# Patient Record
Sex: Male | Born: 1990 | Race: White | Hispanic: No | Marital: Single | Smoking: Never smoker
Health system: Southern US, Community
[De-identification: ages and names within clinical notes are randomized; demographics above are authoritative.]

---

## 2011-11-08 ENCOUNTER — Ambulatory Visit
Admission: RE | Admit: 2011-11-08 | Discharge: 2011-11-08 | Disposition: A | Discharge status: Home / Self Care | Payer: PRIVATE HEALTH INSURANCE | Source: Ambulatory Visit | Attending: Orthopedic Surgery | Admitting: Orthopedic Surgery

## 2011-11-08 ENCOUNTER — Other Ambulatory Visit: Payer: Self-pay | Admitting: Orthopedic Surgery

## 2011-11-08 DIAGNOSIS — T148XXA Other injury of unspecified body region, initial encounter: Secondary | ICD-10-CM

## 2019-08-12 ENCOUNTER — Other Ambulatory Visit: Payer: Self-pay

## 2019-08-12 ENCOUNTER — Encounter (HOSPITAL_COMMUNITY): Payer: Self-pay | Admitting: Emergency Medicine

## 2019-08-12 ENCOUNTER — Observation Stay (HOSPITAL_COMMUNITY)
Admission: EM | Admit: 2019-08-12 | Discharge: 2019-08-14 | Disposition: A | Discharge status: Home / Self Care | Payer: BC Managed Care – PPO | Attending: Surgery | Admitting: Surgery

## 2019-08-12 DIAGNOSIS — K3580 Unspecified acute appendicitis: Secondary | ICD-10-CM | POA: Diagnosis present

## 2019-08-12 DIAGNOSIS — Z79899 Other long term (current) drug therapy: Secondary | ICD-10-CM | POA: Insufficient documentation

## 2019-08-12 DIAGNOSIS — Z20828 Contact with and (suspected) exposure to other viral communicable diseases: Secondary | ICD-10-CM | POA: Insufficient documentation

## 2019-08-12 DIAGNOSIS — R1031 Right lower quadrant pain: Secondary | ICD-10-CM

## 2019-08-12 DIAGNOSIS — Z7982 Long term (current) use of aspirin: Secondary | ICD-10-CM | POA: Insufficient documentation

## 2019-08-12 LAB — COMPREHENSIVE METABOLIC PANEL
ALT: 17 U/L (ref 0–44)
AST: 23 U/L (ref 15–41)
Albumin: 4.4 g/dL (ref 3.5–5.0)
Alkaline Phosphatase: 55 U/L (ref 38–126)
Anion gap: 12 (ref 5–15)
BUN: 12 mg/dL (ref 6–20)
CO2: 28 mmol/L (ref 22–32)
Calcium: 9.5 mg/dL (ref 8.9–10.3)
Chloride: 100 mmol/L (ref 98–111)
Creatinine, Ser: 1.34 mg/dL — ABNORMAL HIGH (ref 0.61–1.24)
GFR calc Af Amer: >60 mL/min (ref >60)
GFR calc non Af Amer: >60 mL/min (ref >60)
Glucose, Bld: 117 mg/dL — ABNORMAL HIGH (ref 70–99)
Potassium: 3.7 mmol/L (ref 3.5–5.1)
Sodium: 140 mmol/L (ref 135–145)
Total Bilirubin: 1.1 mg/dL (ref 0.3–1.2)
Total Protein: 7.3 g/dL (ref 6.5–8.1)

## 2019-08-12 LAB — CBC
HCT: 47.7 % (ref 39.0–52.0)
Hemoglobin: 16.5 g/dL (ref 13.0–17.0)
MCH: 30.6 pg (ref 26.0–34.0)
MCHC: 34.6 g/dL (ref 30.0–36.0)
MCV: 88.5 fL (ref 80.0–100.0)
Platelets: 232 10*3/uL (ref 150–400)
RBC: 5.39 MIL/uL (ref 4.22–5.81)
RDW: 11.9 % (ref 11.5–15.5)
WBC: 16 10*3/uL — ABNORMAL HIGH (ref 4.0–10.5)
nRBC: 0 % (ref 0.0–0.2)

## 2019-08-12 LAB — URINALYSIS, ROUTINE W REFLEX MICROSCOPIC
Bilirubin Urine: NEGATIVE
Glucose, UA: NEGATIVE mg/dL
Hgb urine dipstick: NEGATIVE
Ketones, ur: NEGATIVE mg/dL
Leukocytes,Ua: NEGATIVE
Nitrite: NEGATIVE
Protein, ur: NEGATIVE mg/dL
Specific Gravity, Urine: 1.02 (ref 1.005–1.030)
pH: 6 (ref 5.0–8.0)

## 2019-08-12 LAB — LIPASE, BLOOD: Lipase: 20 U/L (ref 11–51)

## 2019-08-12 NOTE — ED Triage Notes (Signed)
Patient reports RLQ abdominal pain onset 4pm this afternoon , no emesis or diarrhea , denies fever or chills .

## 2019-08-13 ENCOUNTER — Encounter (HOSPITAL_COMMUNITY)
Admission: EM | Disposition: A | Discharge status: Home / Self Care | Payer: Self-pay | Source: Home / Self Care | Attending: Emergency Medicine

## 2019-08-13 ENCOUNTER — Encounter (HOSPITAL_COMMUNITY): Payer: Self-pay | Admitting: Certified Registered Nurse Anesthetist

## 2019-08-13 ENCOUNTER — Emergency Department (HOSPITAL_COMMUNITY): Payer: BC Managed Care – PPO

## 2019-08-13 ENCOUNTER — Observation Stay (HOSPITAL_COMMUNITY): Payer: BC Managed Care – PPO | Admitting: Anesthesiology

## 2019-08-13 DIAGNOSIS — K3580 Unspecified acute appendicitis: Secondary | ICD-10-CM | POA: Diagnosis present

## 2019-08-13 HISTORY — PX: LAPAROSCOPIC APPENDECTOMY: SHX408

## 2019-08-13 LAB — COMPREHENSIVE METABOLIC PANEL
ALT: 17 U/L (ref 0–44)
AST: 20 U/L (ref 15–41)
Albumin: 4.1 g/dL (ref 3.5–5.0)
Alkaline Phosphatase: 51 U/L (ref 38–126)
Anion gap: 11 (ref 5–15)
BUN: 12 mg/dL (ref 6–20)
CO2: 25 mmol/L (ref 22–32)
Calcium: 9.3 mg/dL (ref 8.9–10.3)
Chloride: 102 mmol/L (ref 98–111)
Creatinine, Ser: 1.33 mg/dL — ABNORMAL HIGH (ref 0.61–1.24)
GFR calc Af Amer: >60 mL/min (ref >60)
GFR calc non Af Amer: >60 mL/min (ref >60)
Glucose, Bld: 109 mg/dL — ABNORMAL HIGH (ref 70–99)
Potassium: 4 mmol/L (ref 3.5–5.1)
Sodium: 138 mmol/L (ref 135–145)
Total Bilirubin: 1.6 mg/dL — ABNORMAL HIGH (ref 0.3–1.2)
Total Protein: 7 g/dL (ref 6.5–8.1)

## 2019-08-13 LAB — CBC
HCT: 48 % (ref 39.0–52.0)
Hemoglobin: 16.3 g/dL (ref 13.0–17.0)
MCH: 30.4 pg (ref 26.0–34.0)
MCHC: 34 g/dL (ref 30.0–36.0)
MCV: 89.4 fL (ref 80.0–100.0)
Platelets: 214 10*3/uL (ref 150–400)
RBC: 5.37 MIL/uL (ref 4.22–5.81)
RDW: 11.9 % (ref 11.5–15.5)
WBC: 14.3 10*3/uL — ABNORMAL HIGH (ref 4.0–10.5)
nRBC: 0 % (ref 0.0–0.2)

## 2019-08-13 LAB — HIV ANTIBODY (ROUTINE TESTING W REFLEX): HIV Screen 4th Generation wRfx: NONREACTIVE

## 2019-08-13 LAB — SURGICAL PCR SCREEN
MRSA, PCR: NEGATIVE
Staphylococcus aureus: NEGATIVE

## 2019-08-13 LAB — SARS CORONAVIRUS 2 (TAT 6-24 HRS): SARS Coronavirus 2: NEGATIVE

## 2019-08-13 SURGERY — APPENDECTOMY, LAPAROSCOPIC
Anesthesia: General | Site: Abdomen

## 2019-08-13 MED ORDER — DIPHENHYDRAMINE HCL 50 MG/ML IJ SOLN
INTRAMUSCULAR | Status: DC | PRN
  Administered 2019-08-13: 12.5 mg via INTRAVENOUS

## 2019-08-13 MED ORDER — LIDOCAINE 2% (20 MG/ML) 5 ML SYRINGE
INTRAMUSCULAR | Status: AC
  Filled 2019-08-13: qty 5

## 2019-08-13 MED ORDER — METRONIDAZOLE IN NACL 5-0.79 MG/ML-% IV SOLN
500.0000 mg | Freq: Three times a day (TID) | INTRAVENOUS | Status: DC
  Administered 2019-08-13 – 2019-08-14 (×4): 500 mg via INTRAVENOUS
  Filled 2019-08-13 (×4): qty 100

## 2019-08-13 MED ORDER — FENTANYL CITRATE (PF) 100 MCG/2ML IJ SOLN: 25.0000 ug | INTRAMUSCULAR | Status: DC | PRN

## 2019-08-13 MED ORDER — ONDANSETRON HCL 4 MG/2ML IJ SOLN
INTRAMUSCULAR | Status: DC | PRN
  Administered 2019-08-13: 4 mg via INTRAVENOUS

## 2019-08-13 MED ORDER — METOPROLOL TARTRATE 5 MG/5ML IV SOLN: 5.0000 mg | Freq: Four times a day (QID) | INTRAVENOUS | Status: DC | PRN

## 2019-08-13 MED ORDER — OXYCODONE HCL 5 MG PO TABS
5.0000 mg | ORAL_TABLET | Freq: Four times a day (QID) | ORAL | Status: DC | PRN
  Administered 2019-08-13: 21:00:00 5 mg via ORAL
  Filled 2019-08-13: qty 1

## 2019-08-13 MED ORDER — SODIUM CHLORIDE 0.9 % IR SOLN
Status: DC | PRN
  Administered 2019-08-13: 1000 mL

## 2019-08-13 MED ORDER — PROPOFOL 10 MG/ML IV BOLUS
INTRAVENOUS | Status: DC | PRN
  Administered 2019-08-13: 200 mg via INTRAVENOUS

## 2019-08-13 MED ORDER — LIDOCAINE 2% (20 MG/ML) 5 ML SYRINGE
INTRAMUSCULAR | Status: DC | PRN
  Administered 2019-08-13: 60 mg via INTRAVENOUS

## 2019-08-13 MED ORDER — POLYETHYLENE GLYCOL 3350 17 G PO PACK: 17.0000 g | PACK | Freq: Every day | ORAL | Status: DC | PRN

## 2019-08-13 MED ORDER — MIDAZOLAM HCL 2 MG/2ML IJ SOLN
INTRAMUSCULAR | Status: DC | PRN
  Administered 2019-08-13: 2 mg via INTRAVENOUS

## 2019-08-13 MED ORDER — ONDANSETRON HCL 4 MG/2ML IJ SOLN: 4.0000 mg | Freq: Four times a day (QID) | INTRAMUSCULAR | Status: DC | PRN

## 2019-08-13 MED ORDER — STERILE WATER FOR IRRIGATION IR SOLN
Status: DC | PRN
  Administered 2019-08-13: 1000 mL

## 2019-08-13 MED ORDER — ONDANSETRON 4 MG PO TBDP: 4.0000 mg | ORAL_TABLET | Freq: Four times a day (QID) | ORAL | Status: DC | PRN

## 2019-08-13 MED ORDER — BUPIVACAINE-EPINEPHRINE (PF) 0.25% -1:200000 IJ SOLN
INTRAMUSCULAR | Status: AC
  Filled 2019-08-13: qty 30

## 2019-08-13 MED ORDER — ONDANSETRON HCL 4 MG/2ML IJ SOLN
4.0000 mg | Freq: Once | INTRAMUSCULAR | Status: AC
  Administered 2019-08-13: 02:00:00 4 mg via INTRAVENOUS
  Filled 2019-08-13: qty 2

## 2019-08-13 MED ORDER — LACTATED RINGERS IV SOLN
INTRAVENOUS | Status: DC | PRN
  Administered 2019-08-13 (×2): via INTRAVENOUS

## 2019-08-13 MED ORDER — KCL IN DEXTROSE-NACL 20-5-0.45 MEQ/L-%-% IV SOLN
INTRAVENOUS | Status: DC
  Administered 2019-08-13 – 2019-08-14 (×2): via INTRAVENOUS
  Filled 2019-08-13 (×2): qty 1000

## 2019-08-13 MED ORDER — ROCURONIUM BROMIDE 10 MG/ML (PF) SYRINGE
PREFILLED_SYRINGE | INTRAVENOUS | Status: AC
  Filled 2019-08-13: qty 10

## 2019-08-13 MED ORDER — ACETAMINOPHEN 325 MG PO TABS: 650.0000 mg | ORAL_TABLET | Freq: Four times a day (QID) | ORAL | Status: DC | PRN

## 2019-08-13 MED ORDER — KETOROLAC TROMETHAMINE 30 MG/ML IJ SOLN: 30.0000 mg | Freq: Once | INTRAMUSCULAR | Status: DC

## 2019-08-13 MED ORDER — SODIUM CHLORIDE 0.9 % IV SOLN
2.0000 g | INTRAVENOUS | Status: DC
  Administered 2019-08-13 – 2019-08-14 (×2): 2 g via INTRAVENOUS
  Filled 2019-08-13: qty 2
  Filled 2019-08-13: qty 20

## 2019-08-13 MED ORDER — MORPHINE SULFATE (PF) 2 MG/ML IV SOLN
2.0000 mg | INTRAVENOUS | Status: DC | PRN
  Administered 2019-08-13 (×2): 2 mg via INTRAVENOUS
  Filled 2019-08-13 (×2): qty 1

## 2019-08-13 MED ORDER — HYDROMORPHONE HCL 1 MG/ML IJ SOLN
1.0000 mg | Freq: Once | INTRAMUSCULAR | Status: AC
  Administered 2019-08-13: 1 mg via INTRAVENOUS
  Filled 2019-08-13: qty 1

## 2019-08-13 MED ORDER — BUPIVACAINE-EPINEPHRINE 0.25% -1:200000 IJ SOLN
INTRAMUSCULAR | Status: DC | PRN
  Administered 2019-08-13: 5 mL

## 2019-08-13 MED ORDER — IOHEXOL 300 MG/ML  SOLN
100.0000 mL | Freq: Once | INTRAMUSCULAR | Status: AC | PRN
  Administered 2019-08-13: 02:00:00 100 mL via INTRAVENOUS

## 2019-08-13 MED ORDER — SUGAMMADEX SODIUM 200 MG/2ML IV SOLN
INTRAVENOUS | Status: DC | PRN
  Administered 2019-08-13: 172.4 mg via INTRAVENOUS

## 2019-08-13 MED ORDER — MIDAZOLAM HCL 2 MG/2ML IJ SOLN
INTRAMUSCULAR | Status: AC
  Filled 2019-08-13: qty 2

## 2019-08-13 MED ORDER — ACETAMINOPHEN 650 MG RE SUPP: 650.0000 mg | Freq: Four times a day (QID) | RECTAL | Status: DC | PRN

## 2019-08-13 MED ORDER — FENTANYL CITRATE (PF) 250 MCG/5ML IJ SOLN
INTRAMUSCULAR | Status: AC
  Filled 2019-08-13: qty 5

## 2019-08-13 MED ORDER — ROCURONIUM BROMIDE 10 MG/ML (PF) SYRINGE
PREFILLED_SYRINGE | INTRAVENOUS | Status: DC | PRN
  Administered 2019-08-13: 10 mg via INTRAVENOUS
  Administered 2019-08-13: 40 mg via INTRAVENOUS

## 2019-08-13 MED ORDER — SODIUM CHLORIDE 0.9 % IV SOLN
Freq: Once | INTRAVENOUS | Status: AC
  Administered 2019-08-13: 02:00:00 via INTRAVENOUS

## 2019-08-13 MED ORDER — DEXMEDETOMIDINE HCL IN NACL 200 MCG/50ML IV SOLN
INTRAVENOUS | Status: DC | PRN
  Administered 2019-08-13: 12 ug via INTRAVENOUS

## 2019-08-13 MED ORDER — FENTANYL CITRATE (PF) 250 MCG/5ML IJ SOLN
INTRAMUSCULAR | Status: DC | PRN
  Administered 2019-08-13: 150 ug via INTRAVENOUS
  Administered 2019-08-13 (×2): 50 ug via INTRAVENOUS

## 2019-08-13 MED ORDER — DEXAMETHASONE SODIUM PHOSPHATE 10 MG/ML IJ SOLN
INTRAMUSCULAR | Status: AC
  Filled 2019-08-13: qty 1

## 2019-08-13 MED ORDER — HYDROMORPHONE HCL 1 MG/ML IJ SOLN
1.0000 mg | Freq: Once | INTRAMUSCULAR | Status: AC | PRN
  Administered 2019-08-13: 04:00:00 1 mg via INTRAVENOUS
  Filled 2019-08-13: qty 1

## 2019-08-13 MED ORDER — 0.9 % SODIUM CHLORIDE (POUR BTL) OPTIME
TOPICAL | Status: DC | PRN
  Administered 2019-08-13: 1000 mL

## 2019-08-13 MED ORDER — ONDANSETRON HCL 4 MG/2ML IJ SOLN
INTRAMUSCULAR | Status: AC
  Filled 2019-08-13: qty 2

## 2019-08-13 MED ORDER — DEXAMETHASONE SODIUM PHOSPHATE 10 MG/ML IJ SOLN
INTRAMUSCULAR | Status: DC | PRN
  Administered 2019-08-13: 10 mg via INTRAVENOUS

## 2019-08-13 SURGICAL SUPPLY — 36 items
BLADE CLIPPER SURG (BLADE) ×2 IMPLANT
CANISTER SUCT 3000ML PPV (MISCELLANEOUS) ×3 IMPLANT
CHLORAPREP W/TINT 26 (MISCELLANEOUS) ×3 IMPLANT
COVER SURGICAL LIGHT HANDLE (MISCELLANEOUS) ×3 IMPLANT
COVER WAND RF STERILE (DRAPES) ×3 IMPLANT
CUTTER FLEX LINEAR 45M (STAPLE) ×3 IMPLANT
DERMABOND ADVANCED (GAUZE/BANDAGES/DRESSINGS) ×2
DERMABOND ADVANCED .7 DNX12 (GAUZE/BANDAGES/DRESSINGS) ×1 IMPLANT
ELECT REM PT RETURN 9FT ADLT (ELECTROSURGICAL) ×3
ELECTRODE REM PT RTRN 9FT ADLT (ELECTROSURGICAL) ×1 IMPLANT
GLOVE BIO SURGEON STRL SZ 6 (GLOVE) ×3 IMPLANT
GLOVE INDICATOR 6.5 STRL GRN (GLOVE) ×3 IMPLANT
GOWN STRL REUS W/ TWL LRG LVL3 (GOWN DISPOSABLE) ×3 IMPLANT
GOWN STRL REUS W/TWL LRG LVL3 (GOWN DISPOSABLE) ×6
GRASPER SUT TROCAR 14GX15 (MISCELLANEOUS) ×3 IMPLANT
KIT BASIN OR (CUSTOM PROCEDURE TRAY) ×3 IMPLANT
KIT TURNOVER KIT B (KITS) ×3 IMPLANT
NDL INSUFFLATION 14GA 120MM (NEEDLE) ×1 IMPLANT
NEEDLE INSUFFLATION 14GA 120MM (NEEDLE) ×3 IMPLANT
NS IRRIG 1000ML POUR BTL (IV SOLUTION) ×3 IMPLANT
PAD ARMBOARD 7.5X6 YLW CONV (MISCELLANEOUS) ×6 IMPLANT
POUCH SPECIMEN RETRIEVAL 10MM (ENDOMECHANICALS) ×3 IMPLANT
RELOAD STAPLE 45 3.5 BLU ETS (ENDOMECHANICALS) IMPLANT
RELOAD STAPLE TA45 3.5 REG BLU (ENDOMECHANICALS) ×3 IMPLANT
SET IRRIG TUBING LAPAROSCOPIC (IRRIGATION / IRRIGATOR) ×3 IMPLANT
SET TUBE SMOKE EVAC HIGH FLOW (TUBING) ×3 IMPLANT
SHEARS HARMONIC ACE PLUS 36CM (ENDOMECHANICALS) ×2 IMPLANT
SLEEVE ENDOPATH XCEL 5M (ENDOMECHANICALS) ×3 IMPLANT
SPECIMEN JAR SMALL (MISCELLANEOUS) ×3 IMPLANT
SUT MNCRL AB 4-0 PS2 18 (SUTURE) ×3 IMPLANT
TRAY FOLEY W/BAG SLVR 16FR (SET/KITS/TRAYS/PACK) ×2
TRAY FOLEY W/BAG SLVR 16FR ST (SET/KITS/TRAYS/PACK) ×1 IMPLANT
TRAY LAPAROSCOPIC MC (CUSTOM PROCEDURE TRAY) ×3 IMPLANT
TROCAR XCEL 12X100 BLDLESS (ENDOMECHANICALS) ×3 IMPLANT
TROCAR XCEL NON-BLD 5MMX100MML (ENDOMECHANICALS) ×3 IMPLANT
WATER STERILE IRR 1000ML POUR (IV SOLUTION) ×3 IMPLANT

## 2019-08-13 NOTE — Interval H&P Note (Signed)
History and Physical Interval Note:  08/13/2019 7:59 AM  Jared Chavez  has presented today for surgery, with the diagnosis of Appendicitis.  The various methods of treatment have been discussed with the patient and family. After consideration of risks, benefits and other options for treatment, the patient has consented to  Procedure(s): APPENDECTOMY LAPAROSCOPIC (N/A) as a surgical intervention.  The patient's history has been reviewed, patient examined, no change in status, stable for surgery.  I have reviewed the patient's chart and labs.  Questions were answered to the patient's satisfaction.     [name]

## 2019-08-13 NOTE — Anesthesia Procedure Notes (Signed)
Procedure Name: Intubation Date/Time: 08/13/2019 12:33 PM Performed by: Alain Marion, CRNA Pre-anesthesia Checklist: Patient identified, Emergency Drugs available, Suction available and Patient being monitored Patient Re-evaluated:Patient Re-evaluated prior to induction Oxygen Delivery Method: Circle System Utilized Preoxygenation: Pre-oxygenation with 100% oxygen Induction Type: IV induction Ventilation: Mask ventilation without difficulty Laryngoscope Size: Miller and 2 Grade View: Grade I Tube type: Oral Tube size: 7.0 mm Number of attempts: 1 Airway Equipment and Method: Stylet and Oral airway Placement Confirmation: ETT inserted through vocal cords under direct vision,  positive ETCO2 and breath sounds checked- equal and bilateral Secured at: 21 cm Tube secured with: Tape Dental Injury: Teeth and Oropharynx as per pre-operative assessment

## 2019-08-13 NOTE — Progress Notes (Signed)
Pt returned to room 6N27. Received report from [place], Therapist, sports. See reassessment. Will continue to monitor.

## 2019-08-13 NOTE — ED Notes (Signed)
ED TO INPATIENT HANDOFF REPORT  ED Nurse Name and Phone #: [phone]  S Name/Age/Gender Jared Chavez 28 y.o. male Room/Bed: 024C/024C  Code Status   Code Status: Full Code  Home/SNF/Other Home Patient oriented to: self, place, time and situation Is this baseline? Yes   Triage Complete: Triage complete  Chief Complaint right sided abd pain  Triage Note Patient reports RLQ abdominal pain onset 4pm this afternoon , no emesis or diarrhea , denies fever or chills .    Allergies No Known Allergies  Level of Care/Admitting Diagnosis ED Disposition    ED Disposition Condition Comment   Admit  [facility] [[id number]]  Level of Care: Med-Surg [16]  Covid Evaluation: Asymptomatic Screening Protocol (No Symptoms)  Diagnosis: Acute appendicitis [119147][744919]  Admitting Physician: CCS, MD [[id number]]  Attending Physician: CCS, MD [[id number]]  PT Class (Do Not Modify): Observation [104]  PT Acc Code (Do Not Modify): Observation [10022]       B Medical/Surgery History History reviewed. No pertinent past medical history. History reviewed. No pertinent surgical history.   A IV Location/Drains/Wounds Patient Lines/Drains/Airways Status   Active Line/Drains/Airways    Name:   Placement date:   Placement time:   Site:   Days:   Peripheral IV 08/13/19 Left Forearm   08/13/19    0147    Forearm   less than 1          Intake/Output Last 24 hours No intake or output data in the 24 hours ending 08/13/19 [id number]  Labs/Imaging Results for orders placed or performed during the hospital encounter of 08/12/19 (from the past 48 hour(s))  Lipase, blood     Status: None   Collection Time: 08/12/19  9:55 PM  Result Value Ref Range   Lipase 20 11 - 51 U/L    Comment: Performed at [facility], [address] [place], [place] [address]  Comprehensive metabolic panel     Status: Abnormal   Collection Time: 08/12/19  9:55 PM  Result Value Ref Range   Sodium 140 135 - 145  mmol/L   Potassium 3.7 3.5 - 5.1 mmol/L   Chloride 100 98 - 111 mmol/L   CO2 28 22 - 32 mmol/L   Glucose, Bld 117 (H) 70 - 99 mg/dL   BUN 12 6 - 20 mg/dL   Creatinine, Ser 8.651.34 (H) 0.61 - 1.24 mg/dL   Calcium 9.5 8.9 - 78.410.3 mg/dL   Total Protein 7.3 6.5 - 8.1 g/dL   Albumin 4.4 3.5 - 5.0 g/dL   AST 23 15 - 41 U/L   ALT 17 0 - 44 U/L   Alkaline Phosphatase 55 38 - 126 U/L   Total Bilirubin 1.1 0.3 - 1.2 mg/dL   GFR calc non Af Amer >60 >60 mL/min   GFR calc Af Amer >60 >60 mL/min   Anion gap 12 5 - 15    Comment: Performed at [facility], [address] [place], [place] [address]  CBC     Status: Abnormal   Collection Time: 08/12/19  9:55 PM  Result Value Ref Range   WBC 16.0 (H) 4.0 - 10.5 K/uL   RBC 5.39 4.22 - 5.81 MIL/uL   Hemoglobin 16.5 13.0 - 17.0 g/dL   HCT 52.847.7 41.339.0 - 24.452.0 %   MCV 88.5 80.0 - 100.0 fL   MCH 30.6 26.0 - 34.0 pg   MCHC 34.6 30.0 - 36.0 g/dL   RDW 01.011.9 27.211.5 -  15.5 %   Platelets 232 150 - 400 K/uL   nRBC 0.0 0.0 - 0.2 %    Comment: Performed at [facility], [address] [place], [place] [address]  Urinalysis, Routine w reflex microscopic     Status: None   Collection Time: 08/12/19 10:34 PM  Result Value Ref Range   Color, Urine YELLOW YELLOW   APPearance CLEAR CLEAR   Specific Gravity, Urine 1.020 1.005 - 1.030   pH 6.0 5.0 - 8.0   Glucose, UA NEGATIVE NEGATIVE mg/dL   Hgb urine dipstick NEGATIVE NEGATIVE   Bilirubin Urine NEGATIVE NEGATIVE   Ketones, ur NEGATIVE NEGATIVE mg/dL   Protein, ur NEGATIVE NEGATIVE mg/dL   Nitrite NEGATIVE NEGATIVE   Leukocytes,Ua NEGATIVE NEGATIVE    Comment: Performed at [facility], [address] [place], [place] [address]  Comprehensive metabolic panel     Status: Abnormal   Collection Time: 08/13/19  4:35 AM  Result Value Ref Range   Sodium 138 135 - 145 mmol/L   Potassium 4.0 3.5 - 5.1 mmol/L   Chloride 102 98 - 111 mmol/L   CO2 25 22 - 32 mmol/L   Glucose, Bld 109 (H) 70 - 99  mg/dL   BUN 12 6 - 20 mg/dL   Creatinine, Ser 2.131.33 (H) 0.61 - 1.24 mg/dL   Calcium 9.3 8.9 - 08.610.3 mg/dL   Total Protein 7.0 6.5 - 8.1 g/dL   Albumin 4.1 3.5 - 5.0 g/dL   AST 20 15 - 41 U/L   ALT 17 0 - 44 U/L   Alkaline Phosphatase 51 38 - 126 U/L   Total Bilirubin 1.6 (H) 0.3 - 1.2 mg/dL   GFR calc non Af Amer >60 >60 mL/min   GFR calc Af Amer >60 >60 mL/min   Anion gap 11 5 - 15    Comment: Performed at [facility], [address] [place], [place] [address]  CBC     Status: Abnormal   Collection Time: 08/13/19  4:35 AM  Result Value Ref Range   WBC 14.3 (H) 4.0 - 10.5 K/uL   RBC 5.37 4.22 - 5.81 MIL/uL   Hemoglobin 16.3 13.0 - 17.0 g/dL   HCT 96.248.0 95.239.0 - 84.152.0 %   MCV 89.4 80.0 - 100.0 fL   MCH 30.4 26.0 - 34.0 pg   MCHC 34.0 30.0 - 36.0 g/dL   RDW 32.411.9 40.111.5 - 02.715.5 %   Platelets 214 150 - 400 K/uL   nRBC 0.0 0.0 - 0.2 %    Comment: Performed at [facility], [address] [place], [place] [address]   Ct Abdomen Pelvis W Contrast  Result Date: 08/13/2019 CLINICAL DATA:  28 year old male with right lower quadrant abdominal pain. EXAM: CT ABDOMEN AND PELVIS WITH CONTRAST TECHNIQUE: Multidetector CT imaging of the abdomen and pelvis was performed using the standard protocol following bolus administration of intravenous contrast. CONTRAST:  100mL OMNIPAQUE IOHEXOL 300 MG/ML  SOLN COMPARISON:  None. FINDINGS: Lower chest: The visualized lung bases are clear. No intra-abdominal free air. Small free fluid within pelvis. Hepatobiliary: No focal liver abnormality is seen. No gallstones, gallbladder wall thickening, or biliary dilatation. Pancreas: Unremarkable. No pancreatic ductal dilatation or surrounding inflammatory changes. Spleen: Normal in size without focal abnormality. Adrenals/Urinary Tract: Adrenal glands are unremarkable. Kidneys are normal, without renal calculi, focal lesion, or hydronephrosis. Bladder is unremarkable. Stomach/Bowel: There is no bowel obstruction.  The appendix is enlarged and inflamed. There is  a 6 mm stone at the base of the appendix. The appendix measures approximately 12 mm in diameter. The appendix is located in the right hemipelvis medial to the cecum and anterior to the right external iliac vessels. No fluid collection or abscess. Vascular/Lymphatic: No significant vascular findings are present. No enlarged abdominal or pelvic lymph nodes. Reproductive: The prostate and seminal vesicles are unremarkable. Other: None Musculoskeletal: No acute or significant osseous findings. IMPRESSION: Acute appendicitis. No abscess. Electronically Signed   By: Anner Crete M.D.   On: 08/13/2019 02:33    Pending Labs Unresulted Labs (From admission, onward)    Start     Ordered   08/13/19 0342  HIV antibody (Routine Testing)  Once,   STAT     08/13/19 0342   08/13/19 0239  SARS CORONAVIRUS 2 Nasal Swab Aptima Multi Swab  (Asymptomatic/Tier 2 Patients Labs)  Once,   STAT    Question Answer Comment  Is this test for diagnosis or screening Screening   Symptomatic for COVID-19 as defined by CDC No   Hospitalized for COVID-19 No   Admitted to ICU for COVID-19 No   Previously tested for COVID-19 No   Resident in a congregate (group) care setting No   Employed in healthcare setting No      08/13/19 0238          Vitals/Pain Today's Vitals   08/13/19 0311 08/13/19 0428 08/13/19 0614 08/13/19 0615  BP:   (!) 116/98   Pulse:   65   Resp:   16   Temp:      TempSrc:      SpO2:   98%   Weight:      Height:      PainSc: 2  4   2      Isolation Precautions No active isolations  Medications Medications  dextrose 5 % and 0.45 % NaCl with KCl 20 mEq/L infusion ( Intravenous New Bag/Given 08/13/19 0616)  acetaminophen (TYLENOL) tablet 650 mg (has no administration in time range)    Or  acetaminophen (TYLENOL) suppository 650 mg (has no administration in time range)  oxyCODONE (Oxy IR/ROXICODONE) immediate release tablet 5 mg (has no  administration in time range)  morphine 2 MG/ML injection 2 mg (has no administration in time range)  polyethylene glycol (MIRALAX / GLYCOLAX) packet 17 g (has no administration in time range)  ondansetron (ZOFRAN-ODT) disintegrating tablet 4 mg (has no administration in time range)    Or  ondansetron (ZOFRAN) injection 4 mg (has no administration in time range)  metoprolol tartrate (LOPRESSOR) injection 5 mg (has no administration in time range)  cefTRIAXone (ROCEPHIN) 2 g in sodium chloride 0.9 % 100 mL IVPB (0 g Intravenous Stopped 08/13/19 0615)    And  metroNIDAZOLE (FLAGYL) IVPB 500 mg (500 mg Intravenous New Bag/Given 08/13/19 0615)  HYDROmorphone (DILAUDID) injection 1 mg (1 mg Intravenous Given 08/13/19 0152)  ondansetron (ZOFRAN) injection 4 mg (4 mg Intravenous Given 08/13/19 0152)  0.9 %  sodium chloride infusion ( Intravenous New Bag/Given 08/13/19 0156)  iohexol (OMNIPAQUE) 300 MG/ML solution 100 mL (100 mLs Intravenous Contrast Given 08/13/19 0216)  HYDROmorphone (DILAUDID) injection 1 mg (1 mg Intravenous Given 08/13/19 0428)    Mobility walks Low fall risk   Focused Assessments abdominal pain, acute appendicitis   R Recommendations: See Admitting Provider Note  Report given to:   Additional Notes:  Iv abx, dilaudid for pain, ivf

## 2019-08-13 NOTE — Transfer of Care (Signed)
Immediate Anesthesia Transfer of Care Note  Patient: Hymen Arnett  Procedure(s) Performed: APPENDECTOMY LAPAROSCOPIC (N/A Abdomen)  Patient Location: PACU  Anesthesia Type:General  Level of Consciousness: awake, alert  and oriented  Airway & Oxygen Therapy: Patient Spontanous Breathing and Patient connected to face mask oxygen  Post-op Assessment: Report given to RN and Post -op Vital signs reviewed and stable  Post vital signs: Reviewed and stable  Last Vitals:  Vitals Value Taken Time  BP 137/80 08/13/19 1401  Temp    Pulse 78 08/13/19 1404  Resp 19 08/13/19 1404  SpO2 100 % 08/13/19 1404  Vitals shown include unvalidated device data.  Last Pain:  Vitals:   08/13/19 1127  TempSrc:   PainSc: Asleep         Complications: No apparent anesthesia complications

## 2019-08-13 NOTE — ED Notes (Signed)
consulting Provider at bedside. 

## 2019-08-13 NOTE — ED Provider Notes (Signed)
[facility] EMERGENCY DEPARTMENT Provider Note   CSN: [id number] Arrival date & time: 08/12/19  2112     History   Chief Complaint Chief Complaint  Patient presents with  . Abdominal Pain    HPI Jared Chavez is a 28 y.o. male.     Patient presents to the emergency department with a chief complaint of abdominal pain.  He states that he began having right lower quadrant abdominal pain yesterday evening at about 4 PM.  Had gradually worsened throughout the day today.  He states that it has become more focal in his right lower quadrant.  He denies any fevers, but was noted to have a mildly elevated temperature of 100.1 degrees in triage.  He denies any nausea, vomiting, or diarrhea.  Denies any successful treatments prior to arrival.  No past surgical history.  The history is provided by the patient. No language interpreter was used.    History reviewed. No pertinent past medical history.  There are no active problems to display for this patient.   History reviewed. No pertinent surgical history.      Home Medications    Prior to Admission medications   Not on File    Family History No family history on file.  Social History Social History   Tobacco Use  . Smoking status: Never Smoker  . Smokeless tobacco: Never Used  Substance Use Topics  . Alcohol use: Yes  . Drug use: Never     Allergies   Patient has no known allergies.   Review of Systems Review of Systems  All other systems reviewed and are negative.    Physical Exam Updated Vital Signs BP (!) 144/103   Pulse 86   Temp 98.1 F (36.7 C) (Oral)   Resp 18   SpO2 100%   Physical Exam Vitals signs and nursing note reviewed.  Constitutional:      Appearance: He is well-developed.  HENT:     Head: Normocephalic and atraumatic.  Eyes:     Conjunctiva/sclera: Conjunctivae normal.  Neck:     Musculoskeletal: Neck supple.  Cardiovascular:     Rate and Rhythm: Normal rate  and regular rhythm.     Heart sounds: No murmur.  Pulmonary:     Effort: Pulmonary effort is normal. No respiratory distress.     Breath sounds: Normal breath sounds.  Abdominal:     Palpations: Abdomen is soft.     Tenderness: There is abdominal tenderness.     Comments: Right lower quadrant tenderness Positive Rovsing  Musculoskeletal: Normal range of motion.  Skin:    General: Skin is warm and dry.  Neurological:     Mental Status: He is alert and oriented to person, place, and time.  Psychiatric:        Mood and Affect: Mood normal.        Behavior: Behavior normal.      ED Treatments / Results  Labs (all labs ordered are listed, but only abnormal results are displayed) Labs Reviewed  COMPREHENSIVE METABOLIC PANEL - Abnormal; Notable for the following components:      Result Value   Glucose, Bld 117 (*)    Creatinine, Ser 1.34 (*)    All other components within normal limits  CBC - Abnormal; Notable for the following components:   WBC 16.0 (*)    All other components within normal limits  LIPASE, BLOOD  URINALYSIS, ROUTINE W REFLEX MICROSCOPIC    EKG None  Radiology No results  found.  Procedures Procedures (including critical care time)  Medications Ordered in ED Medications  HYDROmorphone (DILAUDID) injection 1 mg (has no administration in time range)  ondansetron (ZOFRAN) injection 4 mg (has no administration in time range)  0.9 %  sodium chloride infusion (has no administration in time range)     Initial Impression / Assessment and Plan / ED Course  I have reviewed the triage vital signs and the nursing notes.  Pertinent labs & imaging results that were available during my care of the patient were reviewed by me and considered in my medical decision making (see chart for details).        Patient with right lower quadrant pain that started yesterday.  Symptoms have been gradually worsening.  Leukocytosis to 16.  Mildly elevated temperature to  100.1.  Positive McBurney point tenderness.  CT consistent with acute appendicitis.  Appreciate Dr. Sheliah HatchKinsinger for admitting the patient.  Patient seen by and discussed with Dr. Bebe ShaggyWickline.  Final Clinical Impressions(s) / ED Diagnoses   Final diagnoses:  Acute appendicitis, unspecified acute appendicitis type  Right lower quadrant abdominal pain    ED Discharge Orders    None       Roxy HorsemanBrowning, Amyiah Gaba, PA-C 08/13/19 0247    Zadie RhineWickline, Donald, MD 08/13/19 [phone]

## 2019-08-13 NOTE — Op Note (Signed)
Operative Report  Yaroslav Gombos 28 y.o. male  [id number]  [id number]  08/13/2019  Surgeon: Clovis Riley MD FACS  Assistant: none  Procedure performed: Laparoscopic Appendectomy  Preop diagnosis: Acute appendicitis  Post-op diagnosis/intraop findings: Acute appendicitis - with localized peritonitis   Specimens: appendix  EBL: minimal  Complications: none  Description of procedure: After obtaining informed consent the patient was brought to the operating room. He is on standing antibiotics. SCD's were applied. General endotracheal anesthesia was initiated and a formal time-out was performed. A foley catheter was inserted. The abdomen was prepped and draped in the usual sterile fashion and the abdomen was entered using an infraumbilical Veress needle and insufflated to 15 mmHg. A 5 mm trocar and camera were then introduced, the abdomen was inspected and there is no evidence of injury from our entry. A suprapubic 5 mm trocar is placed three fingerbreadths above the pubic symphysis and a left lower quadrant 12 mm trocar were introduced under direct visualization following infiltration with local. The patient was then placed in Trendelenburg and rotated to the left and the small bowel was reflected cephalad. The ileal sail and terminal ileum were adherent to the appendix and were gently bluntly separated from the appendix.  The appendix was visualized: it is severely acutely inflamed and the distal appendix and mesentery are fused to the retroperitoneum. No free fluid or purulence was present. A combination of blunt dissection and harmonic scalpel were used to free the appendix of its retroperitoneal attachments. This was done  Carefully in transparent layers and great care was taken to ensure no injury to surrounding retroperitoneal structures, cecum or terminal ileum. The appendiceal mesentery was then divided with the harmonic scalpel. The mesentery was firm with fibrotic inflammatory  changes. Hemostasis was exellent. A blue load 48mm GIA stapler was used to transect the appendix from the cecum. Minimal appendiceal stump was left in place. Hemostasis was ensured. The appendix was placed in an Endo Catch bag and removed through our 12 mm trocar site. The right lower quadrant was irrigated and aspirated, the effluent was clear. The omentum was brought down to cover the staple line. The 12mm trocar site in the left lower quadrant was closed with a 0 vicryl in the fascia under direct visualization using a PMI device. The abdomen was desufflated and all trocars removed. The skin incisions were closed with running subcuticular monocryl and Dermabond. The foley is removed noting clear yellow urine in the bag. The patient was awakened, extubated and transported to the recovery room in stable condition.   All counts were correct at the completion of the case.   I updated his mother, Threasa Beards [phone]) at the end of the case.

## 2019-08-13 NOTE — Progress Notes (Signed)
Pt arrived to 6N27 via stretcher from the ED. Received report from [place], Therapist, sports. Pt alert and oriented. See assessment. Will continue to monitor.

## 2019-08-13 NOTE — Plan of Care (Signed)

## 2019-08-13 NOTE — H&P (Signed)
**Note Jared-Identified via Obfuscation** Reason for Consult: abdominal pain Referring Physician: [facility]  Jared Chavez is an 28 y.o. male.  HPI: 28 yo male with 2 days of abdominal pain. The pain began periumbilically then moved to the right lower quadrant. He denies nausea or vomiting. He denies fevers or diarrhea. The pain is worse with movement. He has no appetite. He has never had a similar pain.  History reviewed. No pertinent past medical history.  History reviewed. No pertinent surgical history.  No family history on file.  Social History:  reports that he has never smoked. He has never used smokeless tobacco. He reports current alcohol use. He reports that he does not use drugs.  Allergies: No Known Allergies  Medications: I have reviewed the patient's current medications.  Results for orders placed or performed during the hospital encounter of 08/12/19 (from the past 48 hour(s))  Lipase, blood     Status: None   Collection Time: 08/12/19  9:55 PM  Result Value Ref Range   Lipase 20 11 - 51 U/L    Comment: Performed at [facility], [address] [place], [place] [address]  Comprehensive metabolic panel     Status: Abnormal   Collection Time: 08/12/19  9:55 PM  Result Value Ref Range   Sodium 140 135 - 145 mmol/L   Potassium 3.7 3.5 - 5.1 mmol/L   Chloride 100 98 - 111 mmol/L   CO2 28 22 - 32 mmol/L   Glucose, Bld 117 (H) 70 - 99 mg/dL   BUN 12 6 - 20 mg/dL   Creatinine, Ser 1.911.34 (H) 0.61 - 1.24 mg/dL   Calcium 9.5 8.9 - 47.810.3 mg/dL   Total Protein 7.3 6.5 - 8.1 g/dL   Albumin 4.4 3.5 - 5.0 g/dL   AST 23 15 - 41 U/L   ALT 17 0 - 44 U/L   Alkaline Phosphatase 55 38 - 126 U/L   Total Bilirubin 1.1 0.3 - 1.2 mg/dL   GFR calc non Af Amer >60 >60 mL/min   GFR calc Af Amer >60 >60 mL/min   Anion gap 12 5 - 15    Comment: Performed at [facility], [address] [place], [place] [address]  CBC     Status: Abnormal   Collection Time: 08/12/19  9:55 PM  Result Value Ref Range   WBC  16.0 (H) 4.0 - 10.5 K/uL   RBC 5.39 4.22 - 5.81 MIL/uL   Hemoglobin 16.5 13.0 - 17.0 g/dL   HCT 13.047.7 86.539.0 - 78.452.0 %   MCV 88.5 80.0 - 100.0 fL   MCH 30.6 26.0 - 34.0 pg   MCHC 34.6 30.0 - 36.0 g/dL   RDW 69.611.9 29.511.5 - 28.415.5 %   Platelets 232 150 - 400 K/uL   nRBC 0.0 0.0 - 0.2 %    Comment: Performed at [facility], [address] [place], [place] [address]  Urinalysis, Routine w reflex microscopic     Status: None   Collection Time: 08/12/19 10:34 PM  Result Value Ref Range   Color, Urine YELLOW YELLOW   APPearance CLEAR CLEAR   Specific Gravity, Urine 1.020 1.005 - 1.030   pH 6.0 5.0 - 8.0   Glucose, UA NEGATIVE NEGATIVE mg/dL   Hgb urine dipstick NEGATIVE NEGATIVE   Bilirubin Urine NEGATIVE NEGATIVE   Ketones, ur NEGATIVE NEGATIVE mg/dL   Protein, ur NEGATIVE NEGATIVE mg/dL   Nitrite NEGATIVE NEGATIVE   Leukocytes,Ua NEGATIVE NEGATIVE  Comment: Performed at [facility], [place] [address], [place], [place] [address]  Comprehensive metabolic panel     Status: Abnormal   Collection Time: 08/13/19  4:35 AM  Result Value Ref Range   Sodium 138 135 - 145 mmol/L   Potassium 4.0 3.5 - 5.1 mmol/L   Chloride 102 98 - 111 mmol/L   CO2 25 22 - 32 mmol/L   Glucose, Bld 109 (H) 70 - 99 mg/dL   BUN 12 6 - 20 mg/dL   Creatinine, Ser 1.33 (H) 0.61 - 1.24 mg/dL   Calcium 9.3 8.9 - 10.3 mg/dL   Total Protein 7.0 6.5 - 8.1 g/dL   Albumin 4.1 3.5 - 5.0 g/dL   AST 20 15 - 41 U/L   ALT 17 0 - 44 U/L   Alkaline Phosphatase 51 38 - 126 U/L   Total Bilirubin 1.6 (H) 0.3 - 1.2 mg/dL   GFR calc non Af Amer >60 >60 mL/min   GFR calc Af Amer >60 >60 mL/min   Anion gap 11 5 - 15    Comment: Performed at [place] [address] [place], [place] [address]  CBC     Status: Abnormal   Collection Time: 08/13/19  4:35 AM  Result Value Ref Range   WBC 14.3 (H) 4.0 - 10.5 K/uL   RBC 5.37 4.22 - 5.81 MIL/uL   Hemoglobin 16.3 13.0 - 17.0 g/dL   HCT 48.0 39.0 - 52.0 %   MCV 89.4  80.0 - 100.0 fL   MCH 30.4 26.0 - 34.0 pg   MCHC 34.0 30.0 - 36.0 g/dL   RDW 11.9 11.5 - 15.5 %   Platelets 214 150 - 400 K/uL   nRBC 0.0 0.0 - 0.2 %    Comment: Performed at [facility], [place] [address], [place], [place] [address]    Ct Abdomen Pelvis W Contrast  Result Date: 08/13/2019 CLINICAL DATA:  28 year old male with right lower quadrant abdominal pain. EXAM: CT ABDOMEN AND PELVIS WITH CONTRAST TECHNIQUE: Multidetector CT imaging of the abdomen and pelvis was performed using the standard protocol following bolus administration of intravenous contrast. CONTRAST:  164mL OMNIPAQUE IOHEXOL 300 MG/ML  SOLN COMPARISON:  None. FINDINGS: Lower chest: The visualized lung bases are clear. No intra-abdominal free air. Small free fluid within pelvis. Hepatobiliary: No focal liver abnormality is seen. No gallstones, gallbladder wall thickening, or biliary dilatation. Pancreas: Unremarkable. No pancreatic ductal dilatation or surrounding inflammatory changes. Spleen: Normal in size without focal abnormality. Adrenals/Urinary Tract: Adrenal glands are unremarkable. Kidneys are normal, without renal calculi, focal lesion, or hydronephrosis. Bladder is unremarkable. Stomach/Bowel: There is no bowel obstruction. The appendix is enlarged and inflamed. There is a 6 mm stone at the base of the appendix. The appendix measures approximately 12 mm in diameter. The appendix is located in the right hemipelvis medial to the cecum and anterior to the right external iliac vessels. No fluid collection or abscess. Vascular/Lymphatic: No significant vascular findings are present. No enlarged abdominal or pelvic lymph nodes. Reproductive: The prostate and seminal vesicles are unremarkable. Other: None Musculoskeletal: No acute or significant osseous findings. IMPRESSION: Acute appendicitis. No abscess. Electronically Signed   By: Anner Crete M.D.   On: 08/13/2019 02:33    Review of Systems  Constitutional: Negative  for chills and fever.  HENT: Negative for hearing loss.   Eyes: Negative for blurred vision and double vision.  Respiratory: Negative for cough and hemoptysis.   Cardiovascular:  Negative for chest pain and palpitations.  Gastrointestinal: Positive for abdominal pain. Negative for nausea and vomiting.  Genitourinary: Negative for dysuria and urgency.  Musculoskeletal: Negative for myalgias and neck pain.  Skin: Negative for itching and rash.  Neurological: Negative for dizziness, tingling and headaches.  Endo/Heme/Allergies: Does not bruise/bleed easily.  Psychiatric/Behavioral: Negative for depression and suicidal ideas.   Blood pressure (!) 116/98, pulse 65, temperature 98.8 F (37.1 C), temperature source Oral, resp. rate 16, height 5\' 10"  (1.778 m), weight 86.2 kg, SpO2 98 %. Physical Exam  Vitals reviewed. Constitutional: He is oriented to person, place, and time. He appears well-developed and well-nourished.  HENT:  Head: Normocephalic and atraumatic.  Eyes: Pupils are equal, round, and reactive to light. Conjunctivae and EOM are normal.  Neck: Normal range of motion. Neck supple.  Cardiovascular: Normal rate and regular rhythm.  Respiratory: Effort normal and breath sounds normal.  GI: Soft. Bowel sounds are normal. He exhibits no distension. There is abdominal tenderness in the right lower quadrant.  Musculoskeletal: Normal range of motion.  Neurological: He is alert and oriented to person, place, and time.  Skin: Skin is warm and dry.  Psychiatric: He has a normal mood and affect. His behavior is normal.     Assessment/Plan: 28 yo male with acute appendicitis confirmed on CT scan -IV antibiotics -place in observation -plan for lap appendectomy today -we discussed the details of the procedure; that it would be done under general anesthesia, that we would attempt to do the procedure laparoscopically. That the appendix would be isolated from the large and small intestine and  then ligated and removed. We discussed the reason for this is to avoid rupture and infection and resolve the pains. We discussed risks of infection, abscess, injury to intestines or urinary structures, and need for open incision. He showed good understanding and wanted to proceed.   Jared Chavez 08/13/2019, 6:50 AM

## 2019-08-13 NOTE — Anesthesia Preprocedure Evaluation (Addendum)
Anesthesia Evaluation  Patient identified by MRN, date of birth, ID band Patient awake    Reviewed: Allergy & Precautions, NPO status , Patient's Chart, lab work & pertinent test results  Airway Mallampati: III  TM Distance: >3 FB Neck ROM: Full    Dental no notable dental hx.    Pulmonary neg pulmonary ROS,    Pulmonary exam normal breath sounds clear to auscultation       Cardiovascular negative cardio ROS Normal cardiovascular exam Rhythm:Regular Rate:Normal     Neuro/Psych negative neurological ROS  negative psych ROS   GI/Hepatic negative GI ROS, Neg liver ROS,   Endo/Other  negative endocrine ROS  Renal/GU negative Renal ROS     Musculoskeletal negative musculoskeletal ROS (+)   Abdominal   Peds  Hematology negative hematology ROS (+)   Anesthesia Other Findings Appendicitis  Reproductive/Obstetrics                            Anesthesia Physical Anesthesia Plan  ASA: I  Anesthesia Plan: General   Post-op Pain Management:    Induction: Intravenous  PONV Risk Score and Plan: 3 and Midazolam, Dexamethasone, Ondansetron and Treatment may vary due to age or medical condition  Airway Management Planned: Oral ETT  Additional Equipment:   Intra-op Plan:   Post-operative Plan: Extubation in OR  Informed Consent: I have reviewed the patients History and Physical, chart, labs and discussed the procedure including the risks, benefits and alternatives for the proposed anesthesia with the patient or authorized representative who has indicated his/her understanding and acceptance.     Dental advisory given  Plan Discussed with: CRNA  Anesthesia Plan Comments:        Anesthesia Quick Evaluation  

## 2019-08-13 NOTE — Discharge Instructions (Signed)
[facility] LAPAROSCOPIC SURGERY: POST OP INSTRUCTIONS Always review your discharge instruction sheet given to you by the facility where your surgery was performed. IF YOU HAVE DISABILITY OR FAMILY LEAVE FORMS, YOU MUST BRING THEM TO THE OFFICE FOR PROCESSING.   DO NOT GIVE THEM TO YOUR DOCTOR.  PAIN CONTROL  1. First take acetaminophen (Tylenol) AND/or ibuprofen (Advil) to control your pain after surgery.  Follow directions on package.  Taking acetaminophen (Tylenol) and/or ibuprofen (Advil) regularly after surgery will help to control your pain and lower the amount of prescription pain medication you may need.  You should not take more than 3,000 mg (3 grams) of acetaminophen (Tylenol) in 24 hours.  You should not take ibuprofen (Advil), aleve, motrin, naprosyn or other NSAIDS if you have a history of stomach ulcers or chronic kidney disease.  2. A prescription for pain medication may be given to you upon discharge.  Take your pain medication as prescribed, if you still have uncontrolled pain after taking acetaminophen (Tylenol) or ibuprofen (Advil). 3. Use ice packs to help control pain. 4. If you need a refill on your pain medication, please contact your pharmacy.  They will contact our office to request authorization. Prescriptions will not be filled after 5pm or on week-ends.  HOME MEDICATIONS 5. Take your usually prescribed medications unless otherwise directed.  DIET 6. You should follow a light diet the first few days after arrival home.  Be sure to include lots of fluids daily. Avoid fatty, fried foods.   CONSTIPATION 7. It is common to experience some constipation after surgery and if you are taking pain medication.  Increasing fluid intake and taking a stool softener (such as Colace) will usually help or prevent this problem from occurring.  A mild laxative (Milk of Magnesia or Miralax) should be taken according to package instructions if there are no bowel  movements after 48 hours.  WOUND/INCISION CARE 8. Most patients will experience some swelling and bruising in the area of the incisions.  Ice packs will help.  Swelling and bruising can take several days to resolve.  9. Unless discharge instructions indicate otherwise, follow guidelines below  a. STERI-STRIPS - you may remove your outer bandages 48 hours after surgery, and you may shower at that time.  You have steri-strips (small skin tapes) in place directly over the incision.  These strips should be left on the skin for 7-10 days.   b. DERMABOND/SKIN GLUE - you may shower in 24 hours.  The glue will flake off over the next 2-3 weeks. 10. Any sutures or staples will be removed at the office during your follow-up visit.  ACTIVITIES 11. You may resume regular (light) daily activities beginning the next day--such as daily self-care, walking, climbing stairs--gradually increasing activities as tolerated.  You may have sexual intercourse when it is comfortable.  Refrain from any heavy lifting or straining until approved by your doctor. a. You may drive when you are no longer taking prescription pain medication, you can comfortably wear a seatbelt, and you can safely maneuver your car and apply brakes.  FOLLOW-UP 12. You should see your doctor in the office for a follow-up appointment approximately 2-3 weeks after your surgery.  You should have been given your post-op/follow-up appointment when your surgery was scheduled.  If you did not receive a post-op/follow-up appointment, make sure that you call for this appointment within a day or two after you arrive home to insure a convenient appointment time.     WHEN TO CALL YOUR DOCTOR: 1. Fever over 101.0 2. Inability to urinate 3. Continued bleeding from incision. 4. Increased pain, redness, or drainage from the incision. 5. Increasing abdominal pain  The clinic staff is available to answer your questions during regular business hours.  Please don't  hesitate to call and ask to speak to one of the nurses for clinical concerns.  If you have a medical emergency, go to the nearest emergency room or call 911.  A surgeon from [facility] is always on call at the hospital. [address], [address], [place], [place]  [address] ? [address] [address], [place], [place]   [address] [phone] ? [phone] ? FAX [phone] Web site: [email or web address]  .........   Managing Your Pain After Surgery Without Opioids    Thank you for participating in our program to help patients manage their pain after surgery without opioids. This is part of our effort to provide you with the best care possible, without exposing you or your family to the risk that opioids pose.  What pain can I expect after surgery? You can expect to have some pain after surgery. This is normal. The pain is typically worse the day after surgery, and quickly begins to get better. Many studies have found that many patients are able to manage their pain after surgery with Over-the-Counter (OTC) medications such as Tylenol and Motrin. If you have a condition that does not allow you to take Tylenol or Motrin, notify your surgical team.  How will I manage my pain? The best strategy for controlling your pain after surgery is around the clock pain control with Tylenol (acetaminophen) and Motrin (ibuprofen or Advil). Alternating these medications with each other allows you to maximize your pain control. In addition to Tylenol and Motrin, you can use heating pads or ice packs on your incisions to help reduce your pain.  How will I alternate your regular strength over-the-counter pain medication? You will take a dose of pain medication every three hours. ; Start by taking 650 mg of Tylenol (2 pills of 325 mg) ; 3 hours later take 600 mg of Motrin (3 pills of 200 mg) ; 3 hours after taking the Motrin take 650 mg of Tylenol ; 3 hours after that take 600 mg of  Motrin.   - 1 -  See example - if your first dose of Tylenol is at 12:00 PM   12:00 PM Tylenol 650 mg (2 pills of 325 mg)  3:00 PM Motrin 600 mg (3 pills of 200 mg)  6:00 PM Tylenol 650 mg (2 pills of 325 mg)  9:00 PM Motrin 600 mg (3 pills of 200 mg)  Continue alternating every 3 hours   We recommend that you follow this schedule around-the-clock for at least 3 days after surgery, or until you feel that it is no longer needed. Use the table on the last page of this handout to keep track of the medications you are taking. Important: Do not take more than 3000mg of Tylenol or 3200mg of Motrin in a 24-hour period. Do not take ibuprofen/Motrin if you have a history of bleeding stomach ulcers, severe kidney disease, &/or actively taking a blood thinner  What if I still have pain? If you have pain that is not controlled with the over-the-counter pain medications (Tylenol and Motrin or Advil) you might have what we call "breakthrough" pain. You will receive a prescription for a small amount of an opioid pain medication such as   Oxycodone, Tramadol, or Tylenol with Codeine. Use these opioid pills in the first 24 hours after surgery if you have breakthrough pain. Do not take more than 1 pill every 4-6 hours.  If you still have uncontrolled pain after using all opioid pills, don't hesitate to call our staff using the number provided. We will help make sure you are managing your pain in the best way possible, and if necessary, we can provide a prescription for additional pain medication.   Day 1    Time  Name of Medication Number of pills taken  Amount of Acetaminophen  Pain Level   Comments  AM PM       AM PM       AM PM       AM PM       AM PM       AM PM       AM PM       AM PM       Total Daily amount of Acetaminophen Do not take more than  3,000 mg per day      Day 2    Time  Name of Medication Number of pills taken  Amount of Acetaminophen  Pain Level   Comments  AM  PM       AM PM       AM PM       AM PM       AM PM       AM PM       AM PM       AM PM       Total Daily amount of Acetaminophen Do not take more than  3,000 mg per day      Day 3    Time  Name of Medication Number of pills taken  Amount of Acetaminophen  Pain Level   Comments  AM PM       AM PM       AM PM       AM PM          AM PM       AM PM       AM PM       AM PM       Total Daily amount of Acetaminophen Do not take more than  3,000 mg per day      Day 4    Time  Name of Medication Number of pills taken  Amount of Acetaminophen  Pain Level   Comments  AM PM       AM PM       AM PM       AM PM       AM PM       AM PM       AM PM       AM PM       Total Daily amount of Acetaminophen Do not take more than  3,000 mg per day      Day 5    Time  Name of Medication Number of pills taken  Amount of Acetaminophen  Pain Level   Comments  AM PM       AM PM       AM PM       AM PM       AM PM       AM PM       AM PM         AM PM       Total Daily amount of Acetaminophen Do not take more than  3,000 mg per day       Day 6    Time  Name of Medication Number of pills taken  Amount of Acetaminophen  Pain Level  Comments  AM PM       AM PM       AM PM       AM PM       AM PM       AM PM       AM PM       AM PM       Total Daily amount of Acetaminophen Do not take more than  3,000 mg per day      Day 7    Time  Name of Medication Number of pills taken  Amount of Acetaminophen  Pain Level   Comments  AM PM       AM PM       AM PM       AM PM       AM PM       AM PM       AM PM       AM PM       Total Daily amount of Acetaminophen Do not take more than  3,000 mg per day        For additional information about how and where to safely dispose of unused opioid medications - https://www.morepowerfulnc.org  Disclaimer: This document contains information and/or instructional materials adapted from [facility]  for the typical patient with your condition. It does not replace medical advice from your health care provider because your experience may differ from that of the typical patient. Talk to your health care provider if you have any questions about this document, your condition or your treatment plan. Adapted from [facility]  

## 2019-08-14 ENCOUNTER — Encounter (HOSPITAL_COMMUNITY): Payer: Self-pay | Admitting: Surgery

## 2019-08-14 ENCOUNTER — Encounter (HOSPITAL_COMMUNITY): Payer: Self-pay | Admitting: *Deleted

## 2019-08-14 MED ORDER — ACETAMINOPHEN 325 MG PO TABS: 650.0000 mg | ORAL_TABLET | Freq: Four times a day (QID) | ORAL | Status: AC | PRN

## 2019-08-14 MED ORDER — OXYCODONE HCL 5 MG PO TABS: 5.0000 mg | ORAL_TABLET | ORAL | 0 refills | Status: AC | PRN

## 2019-08-14 NOTE — Discharge Summary (Signed)
     Patient ID: Jared Chavez [id number] 24-Oct-1991 28 y.o.  Admit date: 08/12/2019 Discharge date: 08/14/2019  Admitting Diagnosis: Acute appendicitis  Discharge Diagnosis Patient Active Problem List   Diagnosis Date Noted  . Acute appendicitis 08/13/2019    Consultants none  Reason for Admission: 28 yo male with 2 days of abdominal pain. The pain began periumbilically then moved to the right lower quadrant. He denies nausea or vomiting. He denies fevers or diarrhea. The pain is worse with movement. He has no appetite. He has never had a similar pain.  Procedures Lap appy, Dr. Kae Heller 08/13/2019  Hospital Course:  The patient was admitted and underwent a laparoscopic appendectomy.  The patient tolerated the procedure well.  On POD 1, the patient was tolerating a regular diet, voiding well, mobilizing, and pain was controlled with oral pain medications.  The patient was stable for DC home at this time with appropriate follow up made.     Physical Exam: Abd: soft, appropriately tender, +BS, ND, incisions c/d/i  Allergies as of 08/14/2019   No Known Allergies     Medication List    TAKE these medications   acetaminophen 325 MG tablet Commonly known as: TYLENOL Take 2 tablets (650 mg total) by mouth every 6 (six) hours as needed for mild pain (or temp > 100).   aspirin-acetaminophen-caffeine 250-250-65 MG tablet Commonly known as: EXCEDRIN MIGRAINE Take 1-2 tablets by mouth every 6 (six) hours as needed for headache.   oxyCODONE 5 MG immediate release tablet Commonly known as: Oxy IR/ROXICODONE Take 1 tablet (5 mg total) by mouth every 4 (four) hours as needed for moderate pain.        Follow-up Information    Sur[facility], [place]. Go on 08/28/2019.   Specialty: General Surgery Why: Follow up appointment scheduled for 9:45 AM. Please arrive 30 min prior to appointment time. Bring photo ID and insurance information.  Contact information: [place]  [id number] [phone]           Signed: Saverio Danker, [facility] 08/14/2019, 8:54 AM Pager: [phone]

## 2019-08-14 NOTE — Anesthesia Postprocedure Evaluation (Signed)
Anesthesia Post Note  Patient: Jared Chavez  Procedure(s) Performed: APPENDECTOMY LAPAROSCOPIC (N/A Abdomen)     Patient location during evaluation: PACU Anesthesia Type: General Level of consciousness: awake and sedated Pain management: pain level controlled Vital Signs Assessment: post-procedure vital signs reviewed and stable Respiratory status: spontaneous breathing Cardiovascular status: stable Postop Assessment: no apparent nausea or vomiting Anesthetic complications: no    Last Vitals:  Vitals:   08/13/19 2031 08/14/19 0423  BP: 139/86 120/60  Pulse: 80 (!) 56  Resp: 18 18  Temp: 37.2 C 36.7 C  SpO2: 98% 98%    Last Pain:  Vitals:   08/14/19 0810  TempSrc:   PainSc: 2    Pain Goal:                   Huston Foley

## 2020-05-27 IMAGING — CT CT ABDOMEN AND PELVIS WITH CONTRAST
2 of 4 series · 16 of 46 positions shown, 18 images · IV contrast (APPLIED)
Comparison: None.

CLINICAL DATA: 27-year-old male with right lower quadrant abdominal
pain.

EXAM:
CT ABDOMEN AND PELVIS WITH CONTRAST
TECHNIQUE: Multidetector CT imaging of the abdomen and pelvis was performed
using the standard protocol following bolus administration of
intravenous contrast.
CONTRAST:  100mL OMNIPAQUE IOHEXOL 300 MG/ML  SOLN

[Series 3: abdomen 5.0 · axial · 0.71mm/px · z∈[+460,+855]mm · 13 of 93 slices shown, 15 images]
[im 7/93  soft-tissue]
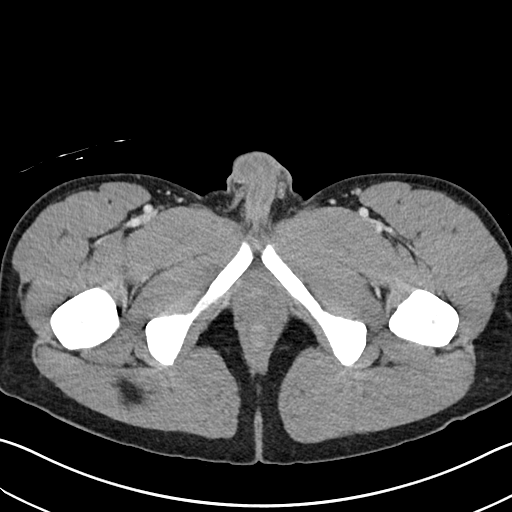
[im 7/93  bone]
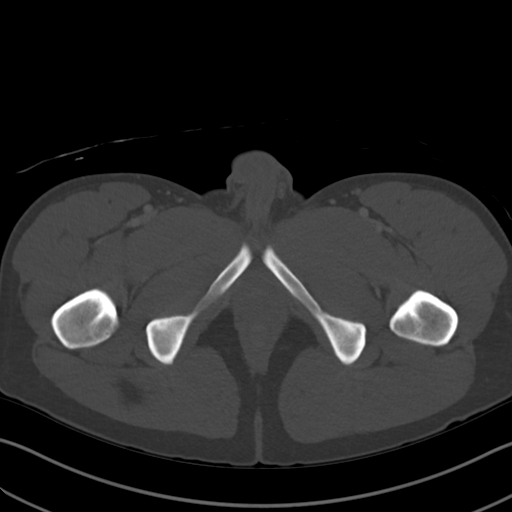
[im 13/93  soft-tissue]
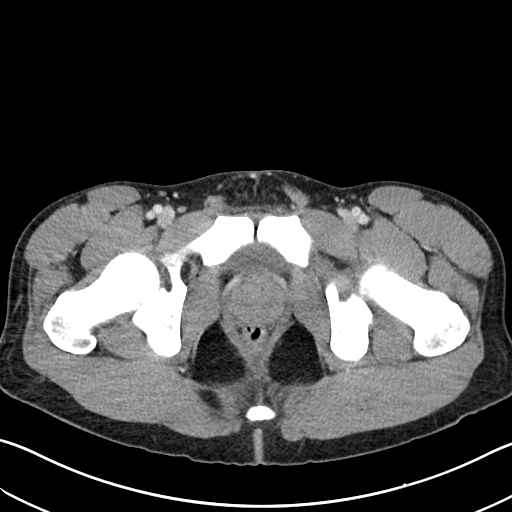
[im 19/93  soft-tissue]
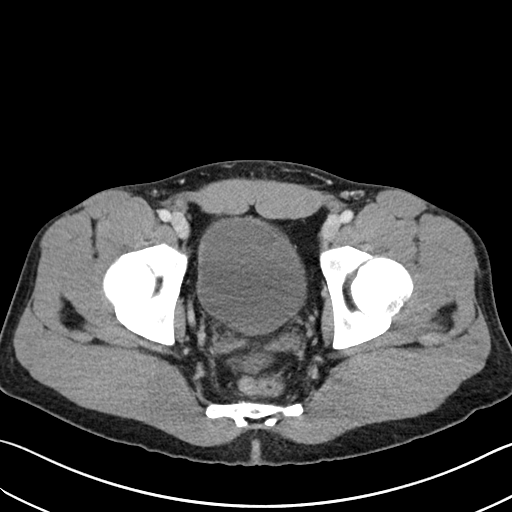
[im 25/93  soft-tissue]
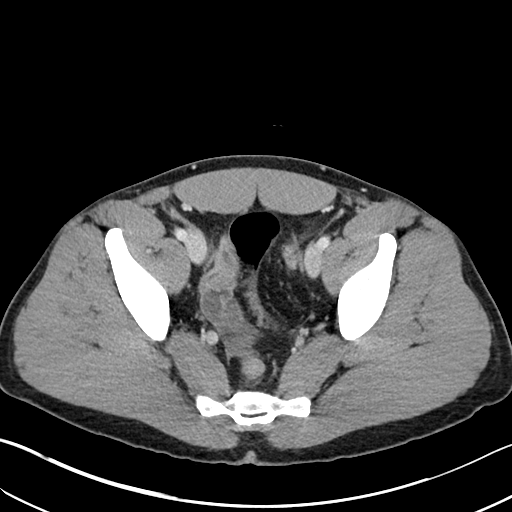
[im 31/93  soft-tissue]
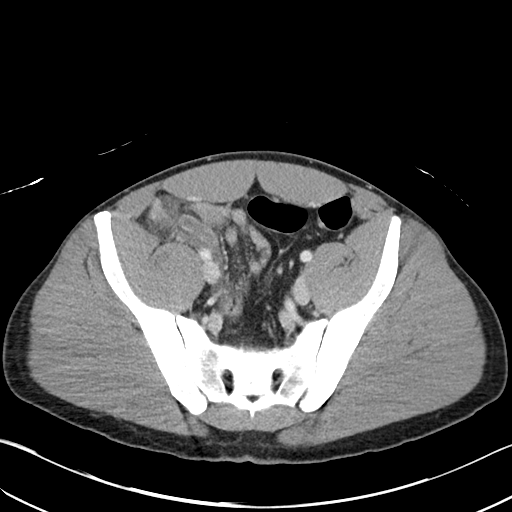
[im 37/93  soft-tissue]
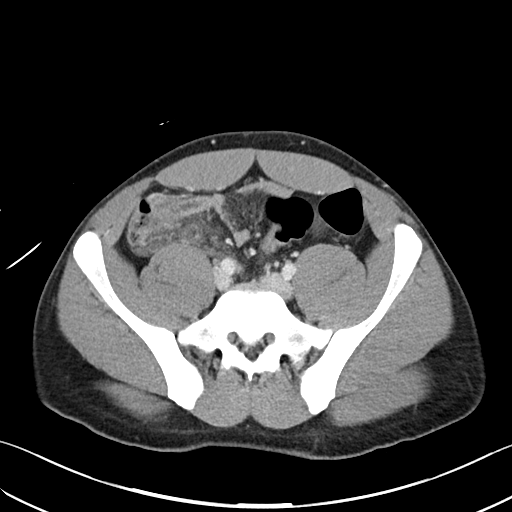
[im 50/93  soft-tissue]
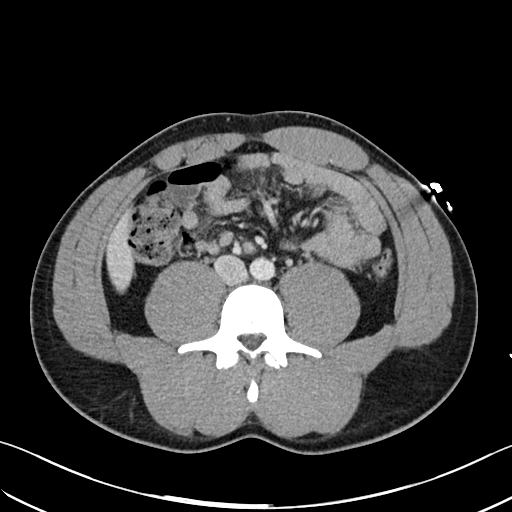
[im 56/93  soft-tissue]
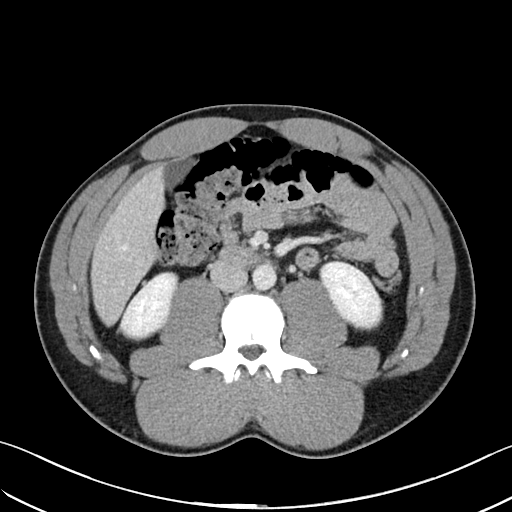
[im 62/93  soft-tissue]
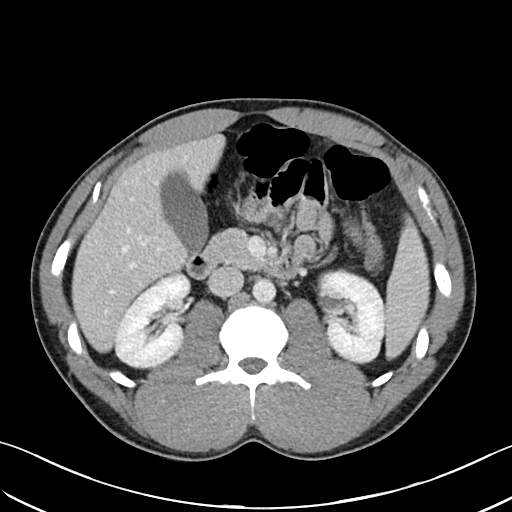
[im 62/93  bone]
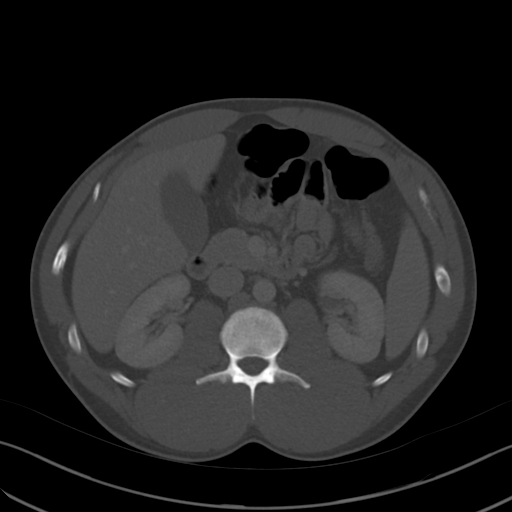
[im 68/93  soft-tissue]
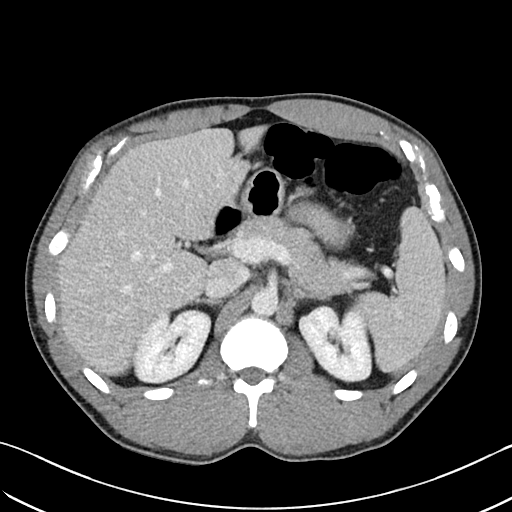
[im 74/93  soft-tissue]
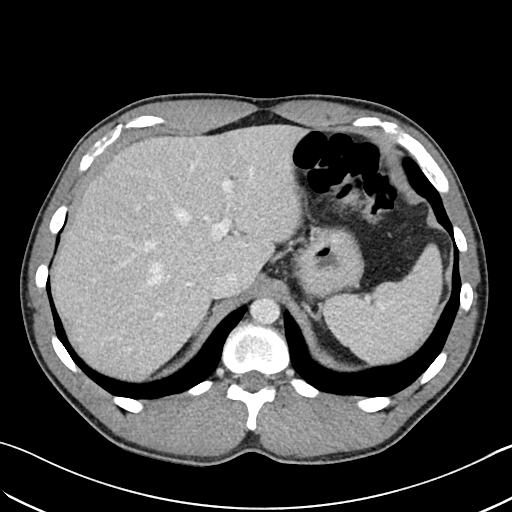
[im 80/93  soft-tissue]
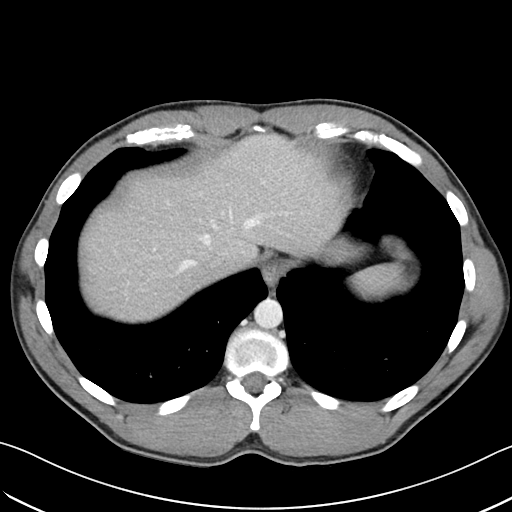
[im 86/93  soft-tissue]
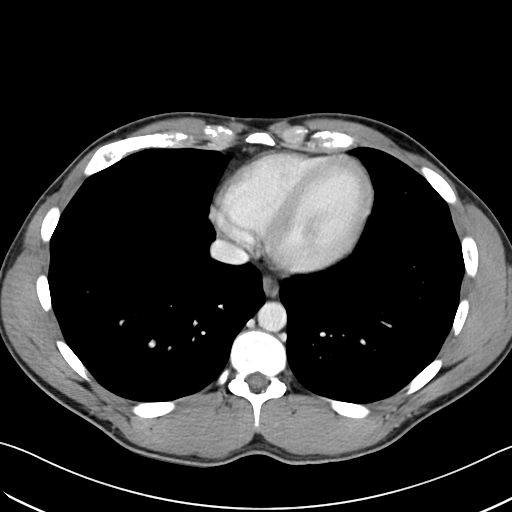

[Series 6: abdomen 3.0 mpr cor · coronal · 0.73mm/px · 3 of 87 slices shown]
[im 29/87  soft-tissue]
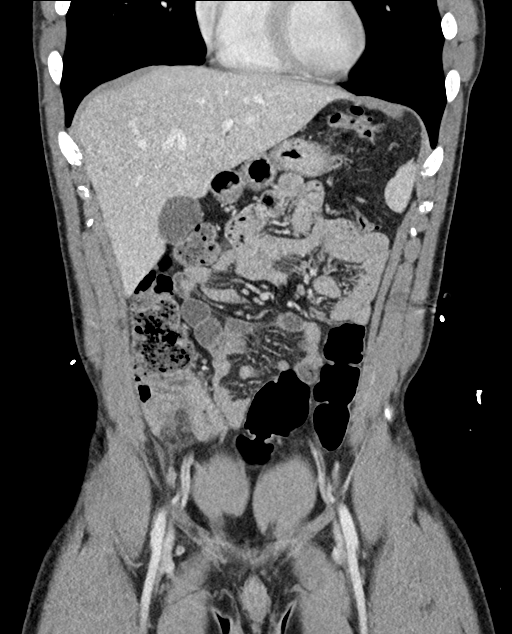
[im 39/87  soft-tissue]
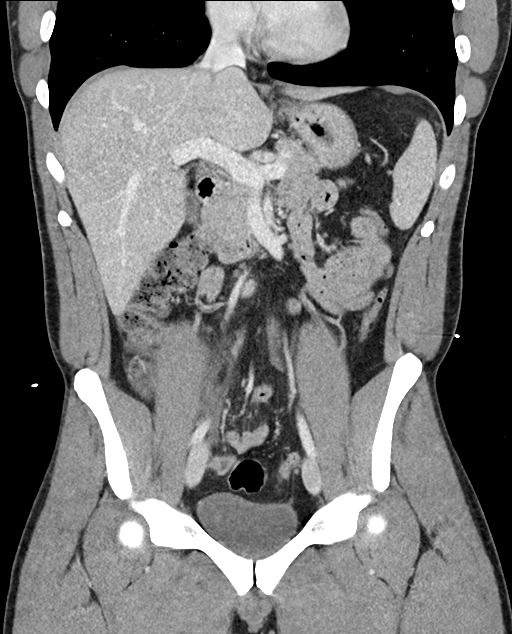
[im 48/87  soft-tissue]
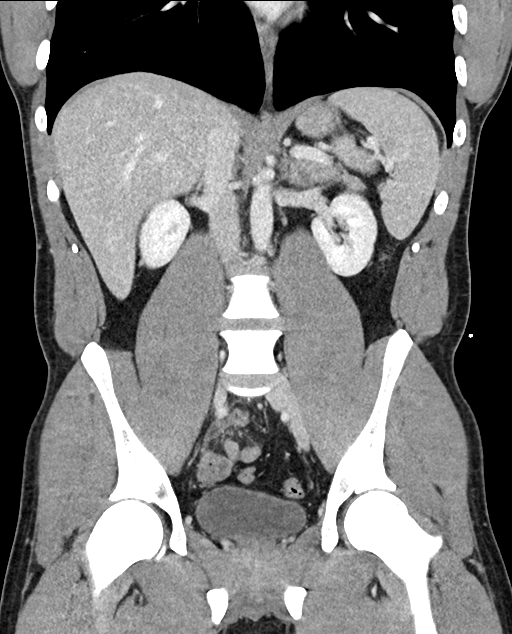

[16 of 46 positions shown; findings below may reference images not displayed]

FINDINGS: Lower chest: The visualized lung bases are clear.

No intra-abdominal free air. Small free fluid within pelvis.

Hepatobiliary: No focal liver abnormality is seen. No gallstones,
gallbladder wall thickening, or biliary dilatation.

Pancreas: Unremarkable. No pancreatic ductal dilatation or
surrounding inflammatory changes.

Spleen: Normal in size without focal abnormality.

Adrenals/Urinary Tract: Adrenal glands are unremarkable. Kidneys are
normal, without renal calculi, focal lesion, or hydronephrosis.
Bladder is unremarkable.

Stomach/Bowel: There is no bowel obstruction. The appendix is
enlarged and inflamed. There is a 6 mm stone at the base of the
appendix. The appendix measures approximately 12 mm in diameter. The
appendix is located in the right hemipelvis medial to the cecum and
anterior to the right external iliac vessels. No fluid collection or
abscess.

Vascular/Lymphatic: No significant vascular findings are present. No
enlarged abdominal or pelvic lymph nodes.

Reproductive: The prostate and seminal vesicles are unremarkable.

Other: None

Musculoskeletal: No acute or significant osseous findings.
IMPRESSION: Acute appendicitis. No abscess.
